# Patient Record
Sex: Female | Born: 1990 | Race: White | Hispanic: No | Marital: Single | Smoking: Current some day smoker
Health system: Southern US, Community
[De-identification: ages and names within clinical notes are randomized; demographics above are authoritative.]

## PROBLEM LIST (undated history)

## (undated) HISTORY — PX: TONSILLECTOMY: SUR1361

---
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
## 2008-06-15 ENCOUNTER — Emergency Department (HOSPITAL_COMMUNITY): Admission: EM | Admit: 2008-06-15 | Discharge: 2008-06-15 | Payer: Self-pay | Admitting: Emergency Medicine

## 2010-03-07 ENCOUNTER — Encounter: Payer: Self-pay | Admitting: Nurse Practitioner

## 2010-04-05 ENCOUNTER — Encounter: Payer: Self-pay | Admitting: Nurse Practitioner

## 2010-06-14 LAB — URINALYSIS, ROUTINE W REFLEX MICROSCOPIC
Bilirubin Urine: NEGATIVE
Nitrite: NEGATIVE
Specific Gravity, Urine: >1.03 — ABNORMAL HIGH (ref 1.005–1.030)
Urobilinogen, UA: 0.2 mg/dL (ref 0.0–1.0)

## 2010-06-14 LAB — POCT I-STAT, CHEM 8
Calcium, Ion: 1.15 mmol/L (ref 1.12–1.32)
Glucose, Bld: 95 mg/dL (ref 70–99)
HCT: 41 % (ref 36.0–46.0)
Hemoglobin: 13.9 g/dL (ref 12.0–15.0)

## 2010-06-14 LAB — PREGNANCY, URINE: Preg Test, Ur: NEGATIVE

## 2013-06-04 ENCOUNTER — Ambulatory Visit: Payer: Self-pay | Admitting: Internal Medicine

## 2014-02-07 ENCOUNTER — Emergency Department (HOSPITAL_COMMUNITY)
Admission: EM | Admit: 2014-02-07 | Discharge: 2014-02-07 | Disposition: A | Discharge status: Home / Self Care | Payer: Medicaid Other | Attending: Emergency Medicine | Admitting: Emergency Medicine

## 2014-02-07 ENCOUNTER — Encounter (HOSPITAL_COMMUNITY): Payer: Self-pay | Admitting: *Deleted

## 2014-02-07 DIAGNOSIS — W57XXXA Bitten or stung by nonvenomous insect and other nonvenomous arthropods, initial encounter: Secondary | ICD-10-CM

## 2014-02-07 DIAGNOSIS — Z720 Tobacco use: Secondary | ICD-10-CM | POA: Insufficient documentation

## 2014-02-07 DIAGNOSIS — X58XXXA Exposure to other specified factors, initial encounter: Secondary | ICD-10-CM | POA: Diagnosis not present

## 2014-02-07 DIAGNOSIS — Y9389 Activity, other specified: Secondary | ICD-10-CM | POA: Insufficient documentation

## 2014-02-07 DIAGNOSIS — Y998 Other external cause status: Secondary | ICD-10-CM | POA: Diagnosis not present

## 2014-02-07 DIAGNOSIS — T63481A Toxic effect of venom of other arthropod, accidental (unintentional), initial encounter: Secondary | ICD-10-CM | POA: Diagnosis present

## 2014-02-07 DIAGNOSIS — Y9289 Other specified places as the place of occurrence of the external cause: Secondary | ICD-10-CM | POA: Diagnosis not present

## 2014-02-07 MED ORDER — CLINDAMYCIN HCL 150 MG PO CAPS: 150.0000 mg | ORAL_CAPSULE | Freq: Four times a day (QID) | ORAL | Status: DC

## 2014-02-07 MED ORDER — DIPHENHYDRAMINE HCL 25 MG PO CAPS
50.0000 mg | ORAL_CAPSULE | Freq: Once | ORAL | Status: AC
  Administered 2014-02-07: 50 mg via ORAL
  Filled 2014-02-07: qty 2

## 2014-02-07 NOTE — Discharge Instructions (Signed)

## 2014-02-07 NOTE — ED Provider Notes (Signed)
CSN: [id number]     Arrival date & time 02/07/14  0608 History   First MD Initiated Contact with Patient 02/07/14 [phone]     Chief Complaint  Patient presents with  . Insect Bite     (Consider location/radiation/quality/duration/timing/severity/associated sxs/prior Treatment) HPI This is a 23 year old female comes in today complaining of insect bite to left upper arm. She states it occurred yesterday morning. She felt something and woke up brushing away altogether and pressure again. She never saw what the insect was noted these 2 areas of the left upper arm that looked like insect stings. She has had some increased redness around the area. She has a stinging sensation and it itches. She has not had any adenopathy, streaking, or fever. She denies any immunosuppression. She has not had any similar episodes in the past. She has not taken any medication or treated this in any way. History reviewed. No pertinent past medical history. Past Surgical History  Procedure Laterality Date  . Tonsillectomy     History reviewed. No pertinent family history. History  Substance Use Topics  . Smoking status: Current Some Day Smoker -- 0.50 packs/day  . Smokeless tobacco: Not on file  . Alcohol Use: Yes     Comment: socially   OB History    No data available     Review of Systems  All other systems reviewed and are negative.     Allergies  Review of patient's allergies indicates no known allergies.  Home Medications   Prior to Admission medications   Medication Sig Start Date End Date Taking? Authorizing Provider  clindamycin (CLEOCIN) 150 MG capsule Take 1 capsule (150 mg total) by mouth every 6 (six) hours. 02/07/14   Hilario Quarryanielle S Sabine Tenenbaum, MD   BP 128/77 mmHg  Pulse 102  Temp(Src) 98.1 F (36.7 C) (Oral)  Resp 20  Ht 5\' 2"  (1.575 m)  Wt 160 lb (72.576 kg)  BMI 29.26 kg/m2  SpO2 100% Physical Exam  Constitutional: She appears well-developed and well-nourished.  HENT:  Head:  Normocephalic.  Eyes: EOM are normal. Pupils are equal, round, and reactive to light.  Neck: Normal range of motion. Neck supple.  Cardiovascular: Normal rate and regular rhythm.   Pulmonary/Chest: Effort normal and breath sounds normal.  Abdominal: Soft.  Musculoskeletal:       Arms: 2 excoriated areas left upper arm with some spreading erythema. Please see photo in chart added by Dr. Lynelle DoctorKnapp.  Nursing note and vitals reviewed.   ED Course  Procedures (including critical care time) Labs Review Labs Reviewed - No data to display  Imaging Review No results found.   EKG Interpretation None      MDM   Final diagnoses:  Insect bite  Local reaction to insect sting, accidental or unintentional, initial encounter   23 year old female previously healthy with insect bite to left upper extremity with surrounding erythema. This Poullard be a large localized reaction or some cellulitis. She is treated with Benadryl and clindamycin. She is instructed regarding return precautions and need for follow-up.    Hilario Quarryanielle S Jenipher Havel, MD 02/07/14 [phone]
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
## 2014-02-07 NOTE — ED Notes (Signed)
Pt c/o "brown recluse bite" that occurred x 1 day ago.  Pt has 2 small raised areas that are circled by redness;

## 2014-08-11 ENCOUNTER — Encounter (HOSPITAL_COMMUNITY): Payer: Self-pay | Admitting: Emergency Medicine

## 2014-08-11 ENCOUNTER — Emergency Department (HOSPITAL_COMMUNITY): Payer: Medicaid Other

## 2014-08-11 ENCOUNTER — Emergency Department (HOSPITAL_COMMUNITY)
Admission: EM | Admit: 2014-08-11 | Discharge: 2014-08-11 | Disposition: A | Discharge status: Home / Self Care | Payer: Medicaid Other | Attending: Emergency Medicine | Admitting: Emergency Medicine

## 2014-08-11 DIAGNOSIS — S39012A Strain of muscle, fascia and tendon of lower back, initial encounter: Secondary | ICD-10-CM

## 2014-08-11 DIAGNOSIS — Z3202 Encounter for pregnancy test, result negative: Secondary | ICD-10-CM | POA: Diagnosis not present

## 2014-08-11 DIAGNOSIS — Y9389 Activity, other specified: Secondary | ICD-10-CM | POA: Insufficient documentation

## 2014-08-11 DIAGNOSIS — Y92410 Unspecified street and highway as the place of occurrence of the external cause: Secondary | ICD-10-CM | POA: Insufficient documentation

## 2014-08-11 DIAGNOSIS — Y998 Other external cause status: Secondary | ICD-10-CM | POA: Insufficient documentation

## 2014-08-11 DIAGNOSIS — M5431 Sciatica, right side: Secondary | ICD-10-CM | POA: Diagnosis not present

## 2014-08-11 DIAGNOSIS — Z720 Tobacco use: Secondary | ICD-10-CM | POA: Insufficient documentation

## 2014-08-11 DIAGNOSIS — S3992XA Unspecified injury of lower back, initial encounter: Secondary | ICD-10-CM | POA: Diagnosis present

## 2014-08-11 LAB — POC URINE PREG, ED: PREG TEST UR: NEGATIVE

## 2014-08-11 MED ORDER — HYDROCODONE-ACETAMINOPHEN 5-325 MG PO TABS: ORAL_TABLET | ORAL | Status: DC

## 2014-08-11 MED ORDER — CYCLOBENZAPRINE HCL 10 MG PO TABS: 10.0000 mg | ORAL_TABLET | Freq: Three times a day (TID) | ORAL | Status: DC | PRN

## 2014-08-11 MED ORDER — HYDROCODONE-ACETAMINOPHEN 5-325 MG PO TABS
1.0000 | ORAL_TABLET | Freq: Once | ORAL | Status: AC
  Administered 2014-08-11: 1 via ORAL
  Filled 2014-08-11: qty 1

## 2014-08-11 MED ORDER — IBUPROFEN 800 MG PO TABS
800.0000 mg | ORAL_TABLET | Freq: Once | ORAL | Status: AC
  Administered 2014-08-11: 800 mg via ORAL
  Filled 2014-08-11: qty 1
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
## 2014-08-11 NOTE — ED Notes (Signed)
Pt made aware to return if symptoms worsen or if any life threatening symptoms occur.   
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
## 2014-08-16 ENCOUNTER — Telehealth: Payer: Self-pay | Admitting: Orthopedic Surgery
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
## 2014-08-19 ENCOUNTER — Encounter (HOSPITAL_COMMUNITY): Payer: Self-pay | Admitting: Cardiology

## 2014-08-19 ENCOUNTER — Emergency Department (HOSPITAL_COMMUNITY)
Admission: EM | Admit: 2014-08-19 | Discharge: 2014-08-19 | Disposition: A | Discharge status: Home / Self Care | Payer: Medicaid Other | Attending: Emergency Medicine | Admitting: Emergency Medicine

## 2014-08-19 DIAGNOSIS — Z720 Tobacco use: Secondary | ICD-10-CM | POA: Diagnosis not present

## 2014-08-19 DIAGNOSIS — M545 Low back pain: Secondary | ICD-10-CM | POA: Diagnosis present

## 2014-08-19 DIAGNOSIS — S39012D Strain of muscle, fascia and tendon of lower back, subsequent encounter: Secondary | ICD-10-CM | POA: Diagnosis not present

## 2014-08-19 DIAGNOSIS — Z7982 Long term (current) use of aspirin: Secondary | ICD-10-CM | POA: Insufficient documentation

## 2014-08-19 DIAGNOSIS — G8911 Acute pain due to trauma: Secondary | ICD-10-CM | POA: Insufficient documentation

## 2014-08-19 MED ORDER — PREDNISONE 10 MG PO TABS: ORAL_TABLET | ORAL | Status: DC

## 2014-08-19 MED ORDER — CYCLOBENZAPRINE HCL 5 MG PO TABS: 5.0000 mg | ORAL_TABLET | Freq: Three times a day (TID) | ORAL | Status: DC | PRN

## 2014-08-19 MED ORDER — TRAMADOL HCL 50 MG PO TABS: 50.0000 mg | ORAL_TABLET | Freq: Four times a day (QID) | ORAL | Status: DC | PRN

## 2014-08-19 NOTE — ED Notes (Signed)
Patient with no complaints at this time. Respirations even and unlabored. Skin warm/dry. Discharge instructions reviewed with patient at this time. Patient given opportunity to voice concerns/ask questions. Patient discharged at this time and left Emergency Department with steady gait.   

## 2014-08-19 NOTE — Discharge Instructions (Signed)
Lumbosacral Strain Lumbosacral strain is a strain of any of the parts that make up your lumbosacral vertebrae. Your lumbosacral vertebrae are the bones that make up the lower third of your backbone. Your lumbosacral vertebrae are held together by muscles and tough, fibrous tissue (ligaments).  CAUSES  A sudden blow to your back can cause lumbosacral strain. Also, anything that causes an excessive stretch of the muscles in the low back can cause this strain. This is typically seen when people exert themselves strenuously, fall, lift heavy objects, bend, or crouch repeatedly. RISK FACTORS  Physically demanding work.  Participation in pushing or pulling sports or sports that require a sudden twist of the back (tennis, golf, baseball).  Weight lifting.  Excessive lower back curvature.  Forward-tilted pelvis.  Weak back or abdominal muscles or both.  Tight hamstrings. SIGNS AND SYMPTOMS  Lumbosacral strain [place] cause pain in the area of your injury or pain that moves (radiates) down your leg.  DIAGNOSIS Your health care provider can often diagnose lumbosacral strain through a physical exam. In some cases, you [place] need tests such as X-ray exams.  TREATMENT  Treatment for your lower back injury depends on many factors that your clinician will have to evaluate. However, most treatment will include the use of anti-inflammatory medicines. HOME CARE INSTRUCTIONS   Avoid hard physical activities (tennis, racquetball, waterskiing) if you are not in proper physical condition for it. This [place] aggravate or create problems.  If you have a back problem, avoid sports requiring sudden body movements. Swimming and walking are generally safer activities.  Maintain good posture.  Maintain a healthy weight.  For acute conditions, you [place] put ice on the injured area.  Put ice in a plastic bag.  Place a towel between your skin and the bag.  Leave the ice on for 20 minutes, 2-3 times a day.  When the  low back starts healing, stretching and strengthening exercises [place] be recommended. SEEK MEDICAL CARE IF:  Your back pain is getting worse.  You experience severe back pain not relieved with medicines. SEEK IMMEDIATE MEDICAL CARE IF:   You have numbness, tingling, weakness, or problems with the use of your arms or legs.  There is a change in bowel or bladder control.  You have increasing pain in any area of the body, including your belly (abdomen).  You notice shortness of breath, dizziness, or feel faint.  You feel sick to your stomach (nauseous), are throwing up (vomiting), or become sweaty.  You notice discoloration of your toes or legs, or your feet get very cold. MAKE SURE YOU:   Understand these instructions.  Will watch your condition.  Will get help right away if you are not doing well or get worse. Document Released: 11/29/2004 Document Revised: 02/24/2013 Document Reviewed: 10/08/2012 [facility] Patient Information 2015 [place], [place]. This information is not intended to replace advice given to you by your health care provider. Make sure you discuss any questions you have with your health care provider.   Use the medicines prescribed.  Do not drive within 4 hours of taking flexeril or tramadol.  See Dr. Romeo Apple as planned if your symptoms persist.  Use a heating pad 20 minutes 3-4 times daily.
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
## 2014-08-31 ENCOUNTER — Ambulatory Visit (INDEPENDENT_AMBULATORY_CARE_PROVIDER_SITE_OTHER): Payer: Medicaid Other

## 2014-08-31 ENCOUNTER — Encounter: Payer: Self-pay | Admitting: Orthopedic Surgery

## 2014-08-31 ENCOUNTER — Ambulatory Visit (INDEPENDENT_AMBULATORY_CARE_PROVIDER_SITE_OTHER): Payer: Medicaid Other | Admitting: Orthopedic Surgery

## 2014-08-31 VITALS — BP 116/73 | Ht 62.0 in | Wt 160.0 lb

## 2014-08-31 DIAGNOSIS — M5440 Lumbago with sciatica, unspecified side: Secondary | ICD-10-CM | POA: Diagnosis not present

## 2014-08-31 DIAGNOSIS — M542 Cervicalgia: Secondary | ICD-10-CM | POA: Diagnosis not present

## 2014-08-31 MED ORDER — IBUPROFEN 800 MG PO TABS: 800.0000 mg | ORAL_TABLET | Freq: Three times a day (TID) | ORAL | Status: AC | PRN

## 2014-08-31 MED ORDER — CYCLOBENZAPRINE HCL 5 MG PO TABS: 5.0000 mg | ORAL_TABLET | Freq: Three times a day (TID) | ORAL | Status: DC | PRN

## 2014-08-31 NOTE — Patient Instructions (Signed)
Need to see Dr Ladona Ridgelaylor ( Chiropractor )   Two meds sent to your pharmacy
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
## 2014-09-08 ENCOUNTER — Telehealth: Payer: Self-pay | Admitting: Orthopedic Surgery

## 2014-09-08 NOTE — Telephone Encounter (Signed)
Patient is aware 

## 2014-09-08 NOTE — Telephone Encounter (Signed)
Please have her relay to Dr Ladona Ridgelaylor   She does not need further care with me
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
## 2014-09-16 ENCOUNTER — Encounter (HOSPITAL_COMMUNITY): Payer: Self-pay | Admitting: Emergency Medicine

## 2014-09-16 ENCOUNTER — Emergency Department (HOSPITAL_COMMUNITY)
Admission: EM | Admit: 2014-09-16 | Discharge: 2014-09-16 | Disposition: A | Discharge status: Home / Self Care | Payer: No Typology Code available for payment source | Attending: Emergency Medicine | Admitting: Emergency Medicine

## 2014-09-16 DIAGNOSIS — Z7982 Long term (current) use of aspirin: Secondary | ICD-10-CM | POA: Insufficient documentation

## 2014-09-16 DIAGNOSIS — R51 Headache: Secondary | ICD-10-CM | POA: Insufficient documentation

## 2014-09-16 DIAGNOSIS — S46819D Strain of other muscles, fascia and tendons at shoulder and upper arm level, unspecified arm, subsequent encounter: Secondary | ICD-10-CM

## 2014-09-16 DIAGNOSIS — F419 Anxiety disorder, unspecified: Secondary | ICD-10-CM | POA: Insufficient documentation

## 2014-09-16 DIAGNOSIS — Z720 Tobacco use: Secondary | ICD-10-CM | POA: Diagnosis not present

## 2014-09-16 DIAGNOSIS — S46919D Strain of unspecified muscle, fascia and tendon at shoulder and upper arm level, unspecified arm, subsequent encounter: Secondary | ICD-10-CM | POA: Diagnosis not present

## 2014-09-16 DIAGNOSIS — S39012D Strain of muscle, fascia and tendon of lower back, subsequent encounter: Secondary | ICD-10-CM | POA: Diagnosis not present

## 2014-09-16 DIAGNOSIS — M545 Low back pain: Secondary | ICD-10-CM | POA: Diagnosis present

## 2014-09-16 MED ORDER — DICLOFENAC SODIUM 75 MG PO TBEC: 75.0000 mg | DELAYED_RELEASE_TABLET | Freq: Two times a day (BID) | ORAL | Status: DC

## 2014-09-16 MED ORDER — ONDANSETRON HCL 4 MG PO TABS
4.0000 mg | ORAL_TABLET | Freq: Once | ORAL | Status: AC
  Administered 2014-09-16: 4 mg via ORAL
  Filled 2014-09-16: qty 1

## 2014-09-16 MED ORDER — DEXAMETHASONE 4 MG PO TABS: 4.0000 mg | ORAL_TABLET | Freq: Two times a day (BID) | ORAL | Status: DC

## 2014-09-16 MED ORDER — DIAZEPAM 5 MG PO TABS: ORAL_TABLET | ORAL | Status: DC

## 2014-09-16 MED ORDER — KETOROLAC TROMETHAMINE 60 MG/2ML IM SOLN
60.0000 mg | Freq: Once | INTRAMUSCULAR | Status: AC
  Administered 2014-09-16: 60 mg via INTRAMUSCULAR
  Filled 2014-09-16: qty 2

## 2014-09-16 MED ORDER — DIAZEPAM 5 MG PO TABS
10.0000 mg | ORAL_TABLET | Freq: Once | ORAL | Status: AC
  Administered 2014-09-16: 10 mg via ORAL
  Filled 2014-09-16: qty 2

## 2014-09-16 MED ORDER — DEXAMETHASONE SODIUM PHOSPHATE 4 MG/ML IJ SOLN
8.0000 mg | Freq: Once | INTRAMUSCULAR | Status: AC
  Administered 2014-09-16: 8 mg via INTRAMUSCULAR
  Filled 2014-09-16: qty 2

## 2014-09-16 NOTE — Discharge Instructions (Signed)
Your vital signs are nonacute at this time. Your examination suggest muscle/musculoskeletal pain and spasm. No gross neurologic deficit appreciated at this time. Please see your chiropractic physician, or your primary physician for referral to pain management. I have listed two specialist in this area that you K[place]h want to consider, or discuss with your primary physician, or your chiropractic physician. Please use of Valium with breakfast lunch and bedtime for muscle spasm and pain. Please also use Decadron and diclofenac daily. Please take these medications with food. Please use warm tub soaks daily for 15-20 minutes. Muscle Strain A muscle strain is an injury that occurs when a muscle is stretched beyond its normal length. Usually a small number of muscle fibers are torn when this happens. Muscle strain is rated in degrees. First-degree strains have the least amount of muscle fiber tearing and pain. Second-degree and third-degree strains have increasingly more tearing and pain.  Usually, recovery from muscle strain takes 1-2 weeks. Complete healing takes 5-6 weeks.  CAUSES  Muscle strain happens when a sudden, violent force placed on a muscle stretches it too far. This [place] occur with lifting, sports, or a fall.  RISK FACTORS Muscle strain is especially common in athletes.  SIGNS AND SYMPTOMS At the site of the muscle strain, there [place] be:  Pain.  Bruising.  Swelling.  Difficulty using the muscle due to pain or lack of normal function. DIAGNOSIS  Your health care provider will perform a physical exam and ask about your medical history. TREATMENT  Often, the best treatment for a muscle strain is resting, icing, and applying cold compresses to the injured area.  HOME CARE INSTRUCTIONS   Use the PRICE method of treatment to promote muscle healing during the first 2-3 days after your injury. The PRICE method involves:  Protecting the muscle from being injured again.  Restricting your  activity and resting the injured body part.  Icing your injury. To do this, put ice in a plastic bag. Place a towel between your skin and the bag. Then, apply the ice and leave it on from 15-20 minutes each hour. After the third day, switch to moist heat packs.  Apply compression to the injured area with a splint or elastic bandage. Be careful not to wrap it too tightly. This [place] interfere with blood circulation or increase swelling.  Elevate the injured body part above the level of your heart as often as you can.  Only take over-the-counter or prescription medicines for pain, discomfort, or fever as directed by your health care provider.  Warming up prior to exercise helps to prevent future muscle strains. SEEK MEDICAL CARE IF:   You have increasing pain or swelling in the injured area.  You have numbness, tingling, or a significant loss of strength in the injured area. MAKE SURE YOU:   Understand these instructions.  Will watch your condition.  Will get help right away if you are not doing well or get worse. Document Released: 02/19/2005 Document Revised: 12/10/2012 Document Reviewed: 09/18/2012 [facility] Patient Information 2015 [place], [place]. This information is not intended to replace advice given to you by your health care provider. Make sure you discuss any questions you have with your health care provider.  Lumbosacral Strain Lumbosacral strain is a strain of any of the parts that make up your lumbosacral vertebrae. Your lumbosacral vertebrae are the bones that make up the lower third of your backbone. Your lumbosacral vertebrae are held together by muscles and tough, fibrous tissue (ligaments).  CAUSES  A sudden blow to your back can cause lumbosacral strain. Also, anything that causes an excessive stretch of the muscles in the low back can cause this strain. This is typically seen when people exert themselves strenuously, fall, lift heavy objects, bend, or crouch  repeatedly. RISK FACTORS  Physically demanding work.  Participation in pushing or pulling sports or sports that require a sudden twist of the back (tennis, golf, baseball).  Weight lifting.  Excessive lower back curvature.  Forward-tilted pelvis.  Weak back or abdominal muscles or both.  Tight hamstrings. SIGNS AND SYMPTOMS  Lumbosacral strain Bruns cause pain in the area of your injury or pain that moves (radiates) down your leg.  DIAGNOSIS Your health care provider can often diagnose lumbosacral strain through a physical exam. In some cases, you [place] need tests such as X-ray exams.  TREATMENT  Treatment for your lower back injury depends on many factors that your clinician will have to evaluate. However, most treatment will include the use of anti-inflammatory medicines. HOME CARE INSTRUCTIONS   Avoid hard physical activities (tennis, racquetball, waterskiing) if you are not in proper physical condition for it. This [place] aggravate or create problems.  If you have a back problem, avoid sports requiring sudden body movements. Swimming and walking are generally safer activities.  Maintain good posture.  Maintain a healthy weight.  For acute conditions, you [place] put ice on the injured area.  Put ice in a plastic bag.  Place a towel between your skin and the bag.  Leave the ice on for 20 minutes, 2-3 times a day.  When the low back starts healing, stretching and strengthening exercises [place] be recommended. SEEK MEDICAL CARE IF:  Your back pain is getting worse.  You experience severe back pain not relieved with medicines. SEEK IMMEDIATE MEDICAL CARE IF:   You have numbness, tingling, weakness, or problems with the use of your arms or legs.  There is a change in bowel or bladder control.  You have increasing pain in any area of the body, including your belly (abdomen).  You notice shortness of breath, dizziness, or feel faint.  You feel sick to your stomach (nauseous),  are throwing up (vomiting), or become sweaty.  You notice discoloration of your toes or legs, or your feet get very cold. MAKE SURE YOU:   Understand these instructions.  Will watch your condition.  Will get help right away if you are not doing well or get worse. Document Released: 11/29/2004 Document Revised: 02/24/2013 Document Reviewed: 10/08/2012 [facility] Patient Information 2015 [place]. This information is not intended to replace advice given to you by your health care provider. Make sure you discuss any questions you have with your health care provider.

## 2014-09-16 NOTE — ED Notes (Signed)
[facility] and has been experiencing continuous pain from back of neck, bilateral shoulder blades, lower back, radiating down bilateral legs. States she was treated here for same.
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
## 2014-09-29 ENCOUNTER — Other Ambulatory Visit (HOSPITAL_COMMUNITY): Payer: Self-pay | Admitting: Nurse Practitioner

## 2014-09-29 DIAGNOSIS — M549 Dorsalgia, unspecified: Secondary | ICD-10-CM

## 2014-09-29 DIAGNOSIS — R202 Paresthesia of skin: Secondary | ICD-10-CM

## 2014-10-12 ENCOUNTER — Ambulatory Visit (HOSPITAL_COMMUNITY)
Admission: RE | Admit: 2014-10-12 | Discharge: 2014-10-12 | Disposition: A | Discharge status: Home / Self Care | Payer: No Typology Code available for payment source | Source: Ambulatory Visit | Attending: Nurse Practitioner | Admitting: Nurse Practitioner

## 2014-10-12 ENCOUNTER — Other Ambulatory Visit (HOSPITAL_COMMUNITY): Payer: Self-pay | Admitting: Nurse Practitioner

## 2014-10-12 DIAGNOSIS — M545 Low back pain: Secondary | ICD-10-CM | POA: Diagnosis present

## 2014-10-12 DIAGNOSIS — M5126 Other intervertebral disc displacement, lumbar region: Secondary | ICD-10-CM | POA: Insufficient documentation

## 2014-10-12 DIAGNOSIS — M549 Dorsalgia, unspecified: Secondary | ICD-10-CM

## 2014-10-12 DIAGNOSIS — E041 Nontoxic single thyroid nodule: Secondary | ICD-10-CM

## 2014-10-12 DIAGNOSIS — R202 Paresthesia of skin: Secondary | ICD-10-CM

## 2014-10-18 ENCOUNTER — Ambulatory Visit (HOSPITAL_COMMUNITY): Admission: RE | Admit: 2014-10-18 | Payer: No Typology Code available for payment source | Source: Ambulatory Visit

## 2014-10-29 ENCOUNTER — Ambulatory Visit (HOSPITAL_COMMUNITY)
Admission: RE | Admit: 2014-10-29 | Discharge: 2014-10-29 | Disposition: A | Discharge status: Home / Self Care | Payer: Medicaid Other | Source: Ambulatory Visit | Attending: Nurse Practitioner | Admitting: Nurse Practitioner

## 2014-10-29 ENCOUNTER — Ambulatory Visit (HOSPITAL_COMMUNITY): Admission: RE | Admit: 2014-10-29 | Payer: No Typology Code available for payment source | Source: Ambulatory Visit

## 2014-10-29 DIAGNOSIS — E041 Nontoxic single thyroid nodule: Secondary | ICD-10-CM

## 2015-01-03 ENCOUNTER — Encounter: Payer: Self-pay | Admitting: "Endocrinology

## 2015-01-03 ENCOUNTER — Ambulatory Visit (INDEPENDENT_AMBULATORY_CARE_PROVIDER_SITE_OTHER): Payer: Medicaid Other | Admitting: "Endocrinology

## 2015-01-03 VITALS — BP 120/79 | HR 100 | Ht 62.0 in | Wt 170.0 lb

## 2015-01-03 DIAGNOSIS — E049 Nontoxic goiter, unspecified: Secondary | ICD-10-CM | POA: Insufficient documentation

## 2015-01-03 DIAGNOSIS — E042 Nontoxic multinodular goiter: Secondary | ICD-10-CM

## 2015-01-03 LAB — TSH: TSH: 0.934 u[IU]/mL (ref 0.350–4.500)

## 2015-01-03 LAB — T4, FREE: FREE T4: 1.01 ng/dL (ref 0.80–1.80)

## 2015-01-03 NOTE — Progress Notes (Signed)
HPI  Julia [name] is a 24 y.o.-year-old female, referred by her PCP, [name], NP, for evaluation for nodular goiter.  Thyroid U/S: On 10/29/2014 showed right lobe 4.9 cm with 1.9 seeing the dominant partially cystic nodule. Left lobe 3.7 cm with no nodules. Pt reports occasional scratchy sensation in her throat however denies dysphagia, choking, voice change, and shortness of breath. I reviewed pt's thyroid tests: From [place] 2016 free T4 0.9 and TSH 1.85.    Pt c/o: Unquantified weight gain over a period of years feeling fatigued. She denies heat intolerance court intolerance palpitations nor sweating   No FH of thyroid ds. No FH of thyroid cancer. No h/o radiation tx to head or neck.   ROS: Constitutional: +weight gain, +fatigue, no subjective hyperthermia/hypothermia Eyes: no blurry vision, no xerophthalmia ENT: no sore throat, no nodules palpated in throat, no dysphagia/odynophagia, no hoarseness Cardiovascular: no CP/SOB/palpitations/leg swelling Respiratory: no cough/SOB Gastrointestinal: no N/V/D/C Musculoskeletal: no muscle/joint aches Skin: no rashes Neurological: no tremors/numbness/tingling/dizziness Psychiatric: no depression/anxiety  PE: BP 120/79 mmHg  Pulse 100  Ht 5\' 2"  (1.575 m)  Wt 170 lb (77.111 kg)  BMI 31.09 kg/m2  SpO2 100% Wt Readings from Last 3 Encounters:  01/03/15 170 lb (77.111 kg)  09/16/14 165 lb (74.844 kg)  08/31/14 160 lb (72.576 kg)    Constitutional: overweight, in NAD Eyes: PERRLA, EOMI, no exophthalmos ENT: moist mucous membranes,  palpable thyroid,  no cervical lymphadenopathy Cardiovascular: RRR, No MRG Respiratory: CTA B Gastrointestinal: abdomen soft, NT, ND, BS+ Musculoskeletal: no deformities, strength intact in all 4;  Skin: moist, warm, no rashes Neurological: no tremor with outstretched hands, DTR normal in all 4  ASSESSMENT: 1. Nodular goiter, 1.9 cm  partially cystic solitary nodule on the right lobe . 2.  Smoker  PLAN:  - I reviewed the  thyroid ultrasound along with the patient. I pointed out that the dominant nodule on the right lobe is  Large enough, this being a risk factor for cancer.  This will need definitive study was tissue sample. I approached her for ultrasound-guided fine needle aspiration and she agrees. Further therapeutic measure will depend on the findings of the biopsy. I will also obtain fresh set of thyroid function test today.  She will return in 10 days with the above results. I counseled her about smoking cessation.   Marquis LunchGebre Pasquale Matters, MD  Tel. [phone], Fax [phone]

## 2015-01-04 LAB — THYROGLOBULIN ANTIBODY: Thyroglobulin Ab: <1 IU/mL (ref <2)

## 2015-01-04 LAB — THYROID PEROXIDASE ANTIBODY: Thyroperoxidase Ab SerPl-aCnc: <1 IU/mL (ref <9)

## 2015-01-12 ENCOUNTER — Ambulatory Visit (HOSPITAL_COMMUNITY): Admission: RE | Admit: 2015-01-12 | Payer: Medicaid Other | Source: Ambulatory Visit

## 2015-01-14 ENCOUNTER — Ambulatory Visit: Payer: Medicaid Other | Admitting: "Endocrinology

## 2015-08-09 ENCOUNTER — Telehealth: Payer: Self-pay

## 2015-08-09 NOTE — Telephone Encounter (Signed)
We saw pt on 01-03-15. She was scheduled for a thyroid biopsy and a follow up with Dr Fransico HimNida. She cancelled the follow up and no showed for the biopsy. She did not reschedule. She is now calling wanting the biopsy scheduled again and to see Dr Fransico HimNida.  Does she need an office visit first?

## 2015-08-10 NOTE — Telephone Encounter (Signed)
Yes, she would need an office visit first.

## 2015-08-10 NOTE — Telephone Encounter (Signed)
Left message for pt to contact our office and schedule appointment to see Dr Fransico HimNida.

## 2015-11-21 ENCOUNTER — Other Ambulatory Visit: Payer: Self-pay | Admitting: "Endocrinology

## 2015-11-21 DIAGNOSIS — E042 Nontoxic multinodular goiter: Secondary | ICD-10-CM

## 2015-11-21 LAB — T4, FREE: FREE T4: 1 ng/dL (ref 0.8–1.8)

## 2015-11-21 LAB — TSH: TSH: 1.49 mIU/L

## 2015-12-06 ENCOUNTER — Encounter: Payer: Self-pay | Admitting: "Endocrinology

## 2015-12-06 ENCOUNTER — Ambulatory Visit (INDEPENDENT_AMBULATORY_CARE_PROVIDER_SITE_OTHER): Payer: Medicaid Other | Admitting: "Endocrinology

## 2015-12-06 VITALS — BP 117/75 | HR 85 | Ht 62.0 in | Wt 137.0 lb

## 2015-12-06 DIAGNOSIS — Z91199 Patient's noncompliance with other medical treatment and regimen due to unspecified reason: Secondary | ICD-10-CM

## 2015-12-06 DIAGNOSIS — E049 Nontoxic goiter, unspecified: Secondary | ICD-10-CM

## 2015-12-06 DIAGNOSIS — Z9119 Patient's noncompliance with other medical treatment and regimen: Secondary | ICD-10-CM | POA: Diagnosis not present

## 2015-12-06 NOTE — Progress Notes (Signed)
HPI  Julia [name] is [name] 25 y.o.-year-old female, referred by her PCP, Julia EveryLindsey Strader, NP, for evaluation for nodular goiter.  - She was seen on October 2016 for nodular goiter and was advised to have fine needle aspiration. However unfortunately she did not follow up with that plan. She returns with repeat thyroid function tests which are within normal limits.  Her  Prior Thyroid U/S: On 10/29/2014 showed right lobe 4.9 cm with 1.9  the dominant partially cystic nodule. Left lobe 3.7 cm with no nodules. Pt still reports occasional scratchy sensation in her throat however denies dysphagia, choking, voice change, and shortness of breath.  I reviewed pt's prior thyroid tests: From [place] 2016 free T4 0.9 and TSH 1.85.    She denies heat intolerance court intolerance palpitations nor sweating. - She has [name] progressive weight loss of about 30 pounds since last year. She continues to smoke heavily.  No FH of thyroid dysfunction. No FH of thyroid cancer. No h/o radiation tx to head or neck.   ROS: Constitutional: + weight loss, +fatigue, no subjective hyperthermia/hypothermia Eyes: no blurry vision, no xerophthalmia ENT: no sore throat, no nodules palpated in throat, no dysphagia/odynophagia, no hoarseness Cardiovascular: no CP/SOB/palpitations/leg swelling Respiratory: no cough/SOB Gastrointestinal: no N/V/D/C Musculoskeletal: no muscle/joint aches Skin: no rashes Neurological: no tremors/numbness/tingling/dizziness Psychiatric: no depression/anxiety  PE: BP 117/75   Pulse 85   Ht 5\' 2"  (1.575 m)   Wt 137 lb (62.1 kg)   BMI 25.06 kg/m  Wt Readings from Last 3 Encounters:  12/06/15 137 lb (62.1 kg)  01/03/15 170 lb (77.1 kg)  09/16/14 165 lb (74.8 kg)    Constitutional:  in NAD Eyes: PERRLA, EOMI, no exophthalmos ENT: moist mucous membranes,  palpable thyroid,  no cervical lymphadenopathy Cardiovascular: RRR, No MRG Respiratory: CTA B Gastrointestinal: abdomen soft, NT, ND,  BS+ Musculoskeletal: no deformities, strength intact in all 4;  Skin: moist, warm, no rashes Neurological: no tremor with outstretched hands, DTR normal in all 4   Results for Julia [name], Julia [name] (MRN [id number]) as of 12/06/2015 10:04  Ref. Range 01/03/2015 09:46 11/21/2015 10:45 11/21/2015 10:47  TSH Latest Units: mIU/L 0.934  1.49  T4,Free(Direct) Latest Ref Range: 0.8 - 1.8 ng/dL 8.291.01 1.0   Thyroglobulin Ab Latest Ref Range: <2 IU/mL <1    Thyroperoxidase Ab SerPl-aCnc Latest Ref Range: <9 IU/mL <1      Thyroid ultrasound on August 2016 IMPRESSION: Single dominant right-sided thyroid nodule with partially cystic and solid features. Nodule meets criteria for biopsy.  Ultrasound-guided fine needle aspiration should be considered.  ASSESSMENT: 1. Nodular goiter, 1.9 cm  partially cystic solitary nodule on the right lobe . 2. Chronic heavy Smoker 3. Unintentional weight loss  PLAN: - Unfortunately patient is noncompliant. She no showed for almost [name] year, when she was supposed to return with fine needle aspiration of her thyroid nodule. - I reviewed the thyroid ultrasound along with the patient again. I pointed out that the dominant nodule on the right lobe is  Large enough, this being [name] risk factor for cancer.  This will need definitive study with [name]  tissue sample. I approached her for ultrasound-guided fine needle aspiration and she agrees. Further therapeutic measure will depend on the findings of the biopsy.  She does not have hyperthyroidism to explain the recent weight loss. She [place] need other studies if  The weight loss continues to be an issue.   I counseled her about smoking cessation.   Marquis LunchGebre Aikam Vinje, MD  Tel. [phone], Fax [phone]

## 2015-12-14 ENCOUNTER — Ambulatory Visit (HOSPITAL_COMMUNITY): Admission: RE | Admit: 2015-12-14 | Payer: Medicaid Other | Source: Ambulatory Visit

## 2015-12-21 ENCOUNTER — Ambulatory Visit: Payer: Medicaid Other | Admitting: "Endocrinology

## 2015-12-22 ENCOUNTER — Other Ambulatory Visit: Payer: Self-pay | Admitting: "Endocrinology

## 2015-12-22 DIAGNOSIS — E041 Nontoxic single thyroid nodule: Secondary | ICD-10-CM

## 2015-12-28 ENCOUNTER — Ambulatory Visit (HOSPITAL_COMMUNITY): Admission: RE | Admit: 2015-12-28 | Payer: No Typology Code available for payment source | Source: Ambulatory Visit

## 2015-12-28 ENCOUNTER — Other Ambulatory Visit (HOSPITAL_COMMUNITY): Payer: Medicaid Other

## 2015-12-30 ENCOUNTER — Other Ambulatory Visit (HOSPITAL_COMMUNITY): Payer: Medicaid Other

## 2015-12-30 ENCOUNTER — Ambulatory Visit (HOSPITAL_COMMUNITY): Payer: Medicaid Other

## 2015-12-30 ENCOUNTER — Ambulatory Visit (HOSPITAL_COMMUNITY): Admission: RE | Admit: 2015-12-30 | Payer: Medicaid Other | Source: Ambulatory Visit

## 2015-12-30 ENCOUNTER — Encounter (HOSPITAL_COMMUNITY): Payer: Self-pay

## 2015-12-30 ENCOUNTER — Ambulatory Visit (HOSPITAL_COMMUNITY)
Admission: RE | Admit: 2015-12-30 | Discharge: 2015-12-30 | Disposition: A | Discharge status: Home / Self Care | Payer: Medicaid Other | Source: Ambulatory Visit | Attending: "Endocrinology | Admitting: "Endocrinology

## 2015-12-30 DIAGNOSIS — E041 Nontoxic single thyroid nodule: Secondary | ICD-10-CM

## 2015-12-30 DIAGNOSIS — E049 Nontoxic goiter, unspecified: Secondary | ICD-10-CM

## 2016-01-04 ENCOUNTER — Encounter (HOSPITAL_COMMUNITY): Payer: Self-pay

## 2016-01-04 ENCOUNTER — Ambulatory Visit (HOSPITAL_COMMUNITY)
Admission: RE | Admit: 2016-01-04 | Discharge: 2016-01-04 | Disposition: A | Discharge status: Home / Self Care | Payer: Medicaid Other | Source: Ambulatory Visit | Attending: "Endocrinology | Admitting: "Endocrinology

## 2016-01-04 ENCOUNTER — Ambulatory Visit: Payer: Medicaid Other | Admitting: "Endocrinology

## 2016-01-04 DIAGNOSIS — E041 Nontoxic single thyroid nodule: Secondary | ICD-10-CM | POA: Insufficient documentation

## 2016-01-04 DIAGNOSIS — E049 Nontoxic goiter, unspecified: Secondary | ICD-10-CM | POA: Diagnosis present

## 2016-01-04 MED ORDER — LIDOCAINE HCL (PF) 2 % IJ SOLN
INTRAMUSCULAR | Status: AC
  Filled 2016-01-04: qty 10

## 2016-01-04 NOTE — Discharge Instructions (Signed)
Thyroid Biopsy °The thyroid gland is a butterfly-shaped gland located in the front of the neck. It produces hormones that affect metabolism, growth and development, and body temperature. Thyroid biopsy is a procedure in which small samples of tissue or fluid are removed from the thyroid gland. The samples are then looked at under a microscope to check for abnormalities. This procedure is done to determine the cause of thyroid problems. It Schrier be done to check for infection, cancer, or other thyroid problems. °Two methods [place] be used for a thyroid biopsy. In one method, a thin needle is inserted through the skin and into the thyroid gland. In the other method, an open incision is made through the skin. °LET YOUR HEALTH CARE PROVIDER KNOW ABOUT:  °· Any allergies you have. °· All medicines you are taking, including vitamins, herbs, eye drops, creams, and over-the-counter medicines. °· Previous problems you or members of your family have had with the use of anesthetics. °· Any blood disorders you have. °· Previous surgeries you have had. °· Medical conditions you have. °RISKS AND COMPLICATIONS °Generally, this is a safe procedure. However, problems can occur and include: °· Bleeding from the procedure site. °· Infection. °· Injury to structures near the thyroid gland. °BEFORE THE PROCEDURE  °· Ask your health care provider about: °¨ Changing or stopping your regular medicines. This is especially important if you are taking diabetes medicines or blood thinners. °¨ Taking medicines such as aspirin and ibuprofen. These medicines can thin your blood. Do not take these medicines before your procedure if your health care provider asks you not to. °· Do not eat or drink anything after midnight on the night before the procedure or as directed by your health care provider. °· You Guertin have a blood sample taken. °PROCEDURE °Either of these methods [place] be used to perform a thyroid biopsy: °· Fine needle biopsy. You Goodness be given  medicine to help you relax (sedative). You will be asked to lie on your back with your head tipped backward to extend your neck. An area on your neck will be cleaned. A needle will then be inserted through the skin of your neck. You Punt be asked to avoid coughing, talking, swallowing, or making sounds during some portions of the procedure. The needle will be withdrawn once the tissue or fluid samples have been removed. Pressure Jonas be applied to your neck to reduce swelling and ensure that bleeding has stopped. The samples will be sent to a lab for examination. °· Open biopsy. You will be given medicine to make you sleep (general anesthetic). An incision will be made in your neck. A sample of thyroid tissue will be removed using surgical tools. The tissue sample will be sent for examination. In some cases, the sample Melichar be examined during the biopsy. If that is done and cancer cells are found, some or all of the thyroid gland Kuznia be removed. The incision will be closed with stitches. °AFTER THE PROCEDURE  °· Your recovery will be assessed and monitored. °· You Dugal have soreness and tenderness at the site of the biopsy. This should go away after a few days. °· If you had an open biopsy, you Batson have a hoarse voice or sore throat for a couple days. °· It is your responsibility to get your test results. °  °This information is not intended to replace advice given to you by your health care provider. Make sure you discuss any questions you have with your health   care provider. °  °Document Released: 12/17/2006 Document Revised: 03/12/2014 Document Reviewed: 05/14/2013 °Elsevier Interactive Patient Education ©2016 [employer] ° °

## 2016-01-04 NOTE — Procedures (Signed)
PreOperative Dx: RT thyroid nodule Postoperative Dx: RT thyroid nodule Procedure:   US guided FNA of RT thyroid nodule Radiologist:  Denym Rahimi Anesthesia:  2 ml of 2% lidocaine Specimen:  FNA x 3  EBL:   < 1 ml Complications: None 

## 2016-01-11 ENCOUNTER — Encounter: Payer: Self-pay | Admitting: "Endocrinology

## 2016-01-11 ENCOUNTER — Ambulatory Visit (INDEPENDENT_AMBULATORY_CARE_PROVIDER_SITE_OTHER): Payer: Medicaid Other | Admitting: "Endocrinology

## 2016-01-11 VITALS — BP 125/78 | HR 93 | Ht 62.0 in | Wt 135.0 lb

## 2016-01-11 DIAGNOSIS — E049 Nontoxic goiter, unspecified: Secondary | ICD-10-CM | POA: Diagnosis not present

## 2016-01-11 NOTE — Progress Notes (Signed)
HPI  Julia [name] is [name] 25 y.o.-year-old female, referred by her PCP, Julia EveryLindsey Strader, NP, for evaluation for nodular goiter.  - She is returning with her results of fine needle aspiration of solitary thyroid nodule in the right lobe.  Her  Prior Thyroid U/S: On 10/29/2014 showed right lobe 4.9 cm with 1.9  the dominant partially cystic nodule. Left lobe 3.7 cm with no nodules. Pt still reports occasional scratchy sensation in her throat however denies dysphagia, choking, voice change, and shortness of breath.  I reviewed pt's prior thyroid tests: From [place] 2016 free T4 0.9 and TSH 1.85.    She denies heat intolerance court intolerance palpitations nor sweating. - She has [name] progressive weight loss of about 30 pounds since last year, but stable weight since last visit. She continues to smoke heavily, and complains of on and off voice hoarseness.  No FH of thyroid dysfunction. No FH of thyroid cancer. No h/o radiation tx to head or neck.   ROS: Constitutional: + weight loss, +fatigue, no subjective hyperthermia/hypothermia Eyes: no blurry vision, no xerophthalmia ENT: no sore throat, no nodules palpated in throat, no dysphagia/odynophagia, no hoarseness Cardiovascular: no CP/SOB/palpitations/leg swelling Respiratory: no cough/SOB Gastrointestinal: no N/V/D/C Musculoskeletal: no muscle/joint aches Skin: no rashes Neurological: no tremors/numbness/tingling/dizziness Psychiatric: no depression/anxiety  PE: BP 125/78   Pulse 93   Ht 5\' 2"  (1.575 m)   Wt 135 lb (61.2 kg)   BMI 24.69 kg/m  Wt Readings from Last 3 Encounters:  01/11/16 135 lb (61.2 kg)  12/06/15 137 lb (62.1 kg)  01/03/15 170 lb (77.1 kg)    Constitutional:  in NAD Eyes: PERRLA, EOMI, no exophthalmos ENT: moist mucous membranes,  palpable thyroid,  no cervical lymphadenopathy Cardiovascular: RRR, No MRG Respiratory: CTA B Gastrointestinal: abdomen soft, NT, ND, BS+ Musculoskeletal: no deformities, strength intact  in all 4;  Skin: moist, warm, no rashes Neurological: no tremor with outstretched hands, DTR normal in all 4   Results for Julia [name] (MRN [id number]) as of 12/06/2015 10:04  Ref. Range 01/03/2015 09:46 11/21/2015 10:45 11/21/2015 10:47  TSH Latest Units: mIU/L 0.934  1.49  T4,Free(Direct) Latest Ref Range: 0.8 - 1.8 ng/dL 4.091.01 1.0   Thyroglobulin Ab Latest Ref Range: <2 IU/mL <1    Thyroperoxidase Ab SerPl-aCnc Latest Ref Range: <9 IU/mL <1      Thyroid ultrasound on August 2016 IMPRESSION: Single dominant right-sided thyroid nodule with partially cystic and solid features.  Ultrasound-guided fine needle aspiration Showed benign follicular nodule.  ASSESSMENT: 1. Nodular goiter, 1.9 cm  partially cystic solitary nodule on the right lobe - status post FNA showing benign follicular nodule. 2. Chronic heavy Smoker 3. Unintentional weight loss  PLAN: - Her FNA is benign, no need for antithyroid intervention at this time. She does not have hyperthyroidism to explain the recent weight loss. - I advised her to be current on her Pap smear, if voice hoarseness continues to be [name] problem she Renovato need evaluation by ENT as [name] chronic heavy smoker. - I also advised her to try omeprazole for 8 weeks for [name] presumptive diagnosis of GERD.  I counseled her about smoking cessation.   Julia LunchGebre Marly Schuld, MD  Tel. [phone], Fax [phone]

## 2016-07-11 ENCOUNTER — Ambulatory Visit: Payer: Medicaid Other | Admitting: "Endocrinology

## 2017-01-13 IMAGING — US US THYROID
1 series · 13 of 25 positions shown · non-contrast
Comparison: Thyroid ultrasound - 10/29/2014

CLINICAL DATA: Prior ultrasound follow-up.

EXAM:
THYROID ULTRASOUND
TECHNIQUE: Ultrasound examination of the thyroid gland and adjacent soft
tissues was performed.

[Series 1: us thyroid · 0.05mm/px · 13 of 60 slices shown]
[im 1/60]
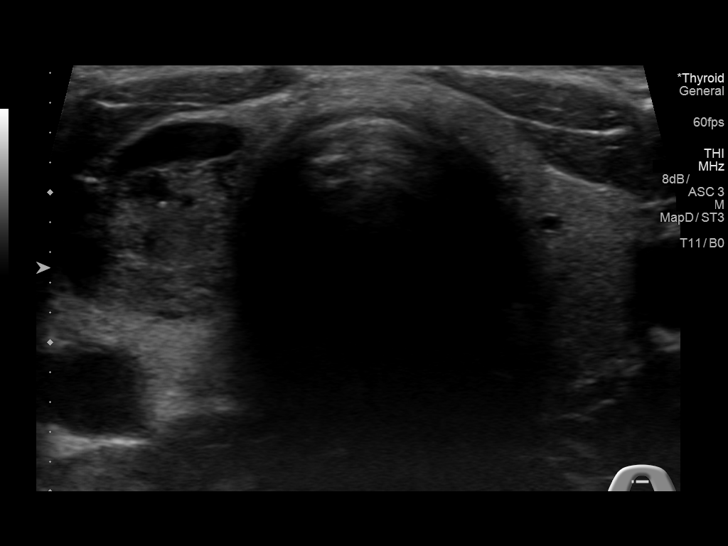
[im 5/60]
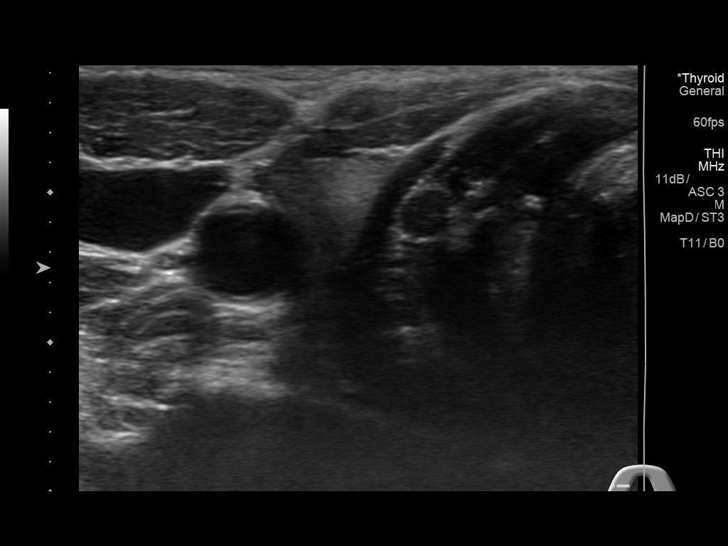
[im 10/60]
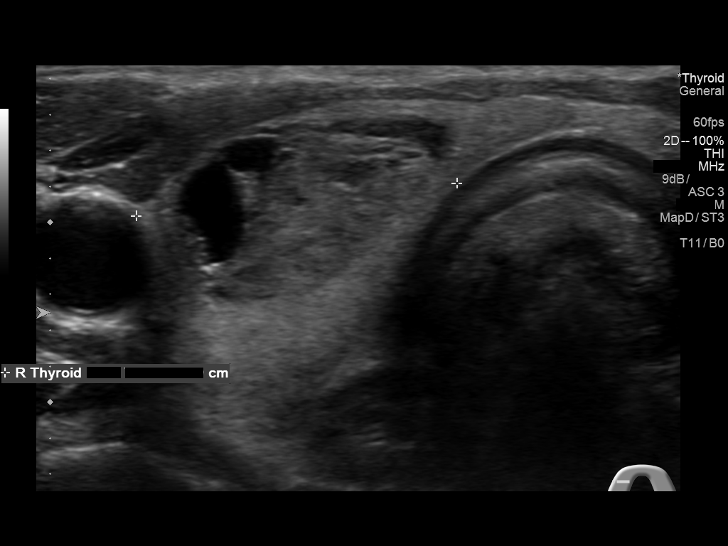
[im 15/60]
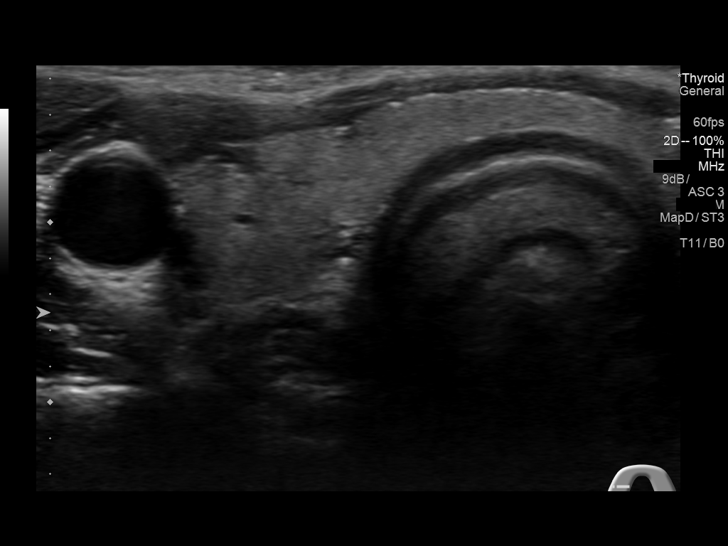
[im 20/60]
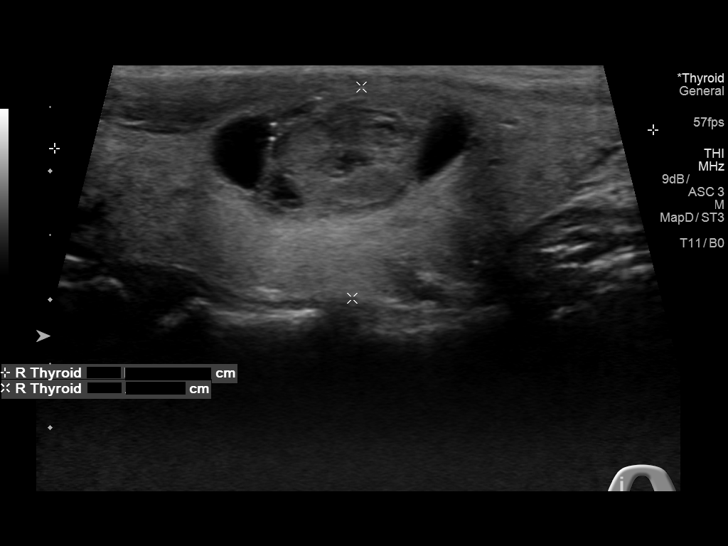
[im 25/60]
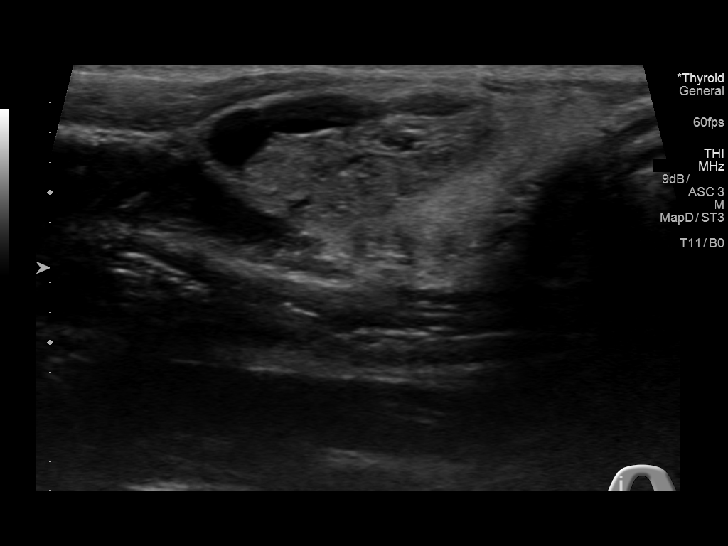
[im 30/60]
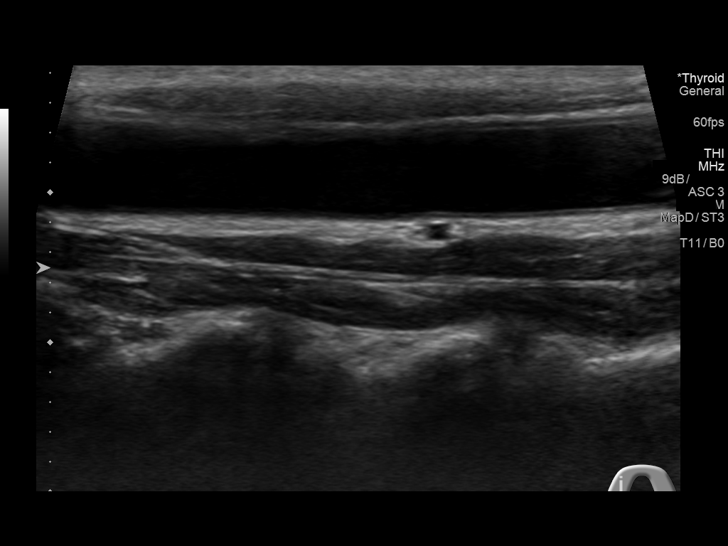
[im 35/60]
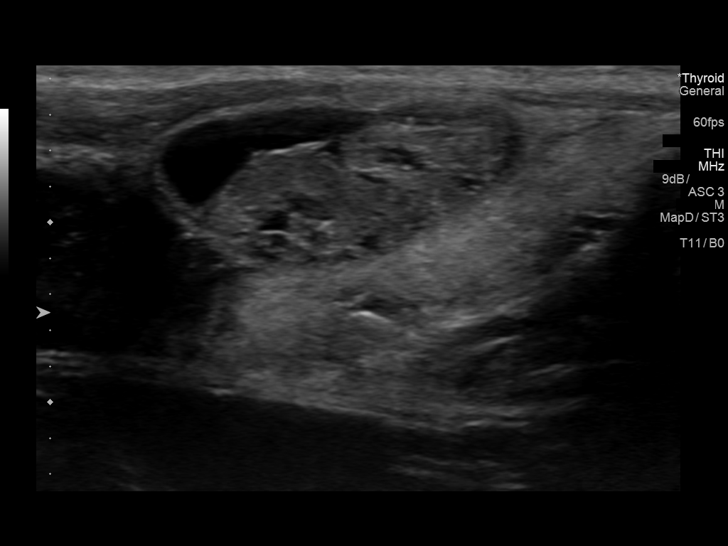
[im 40/60]
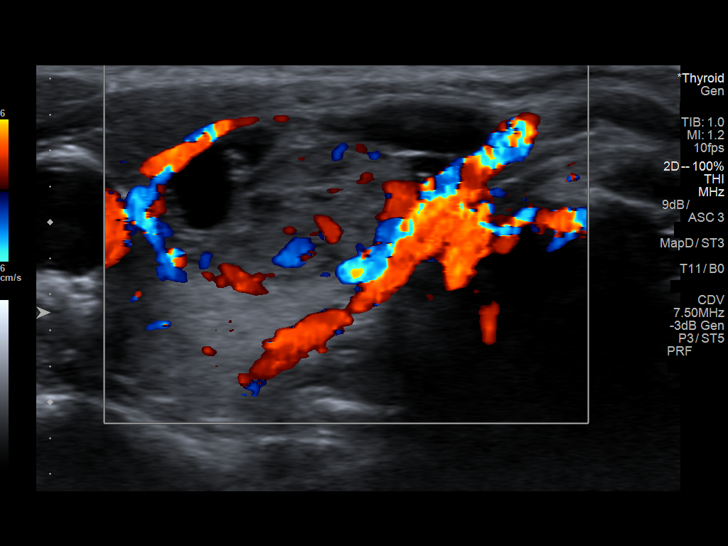
[im 45/60]
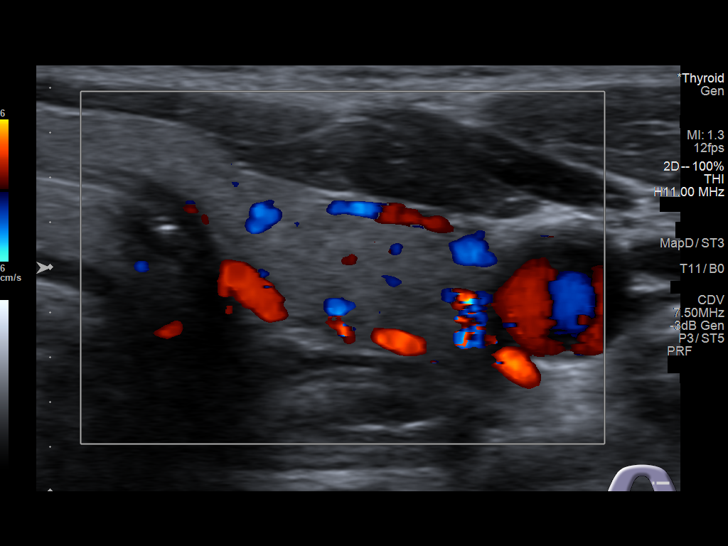
[im 50/60]
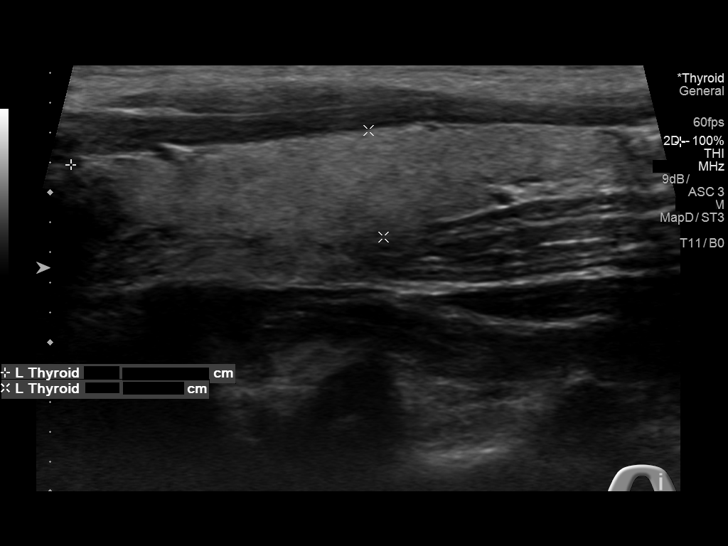
[im 55/60]
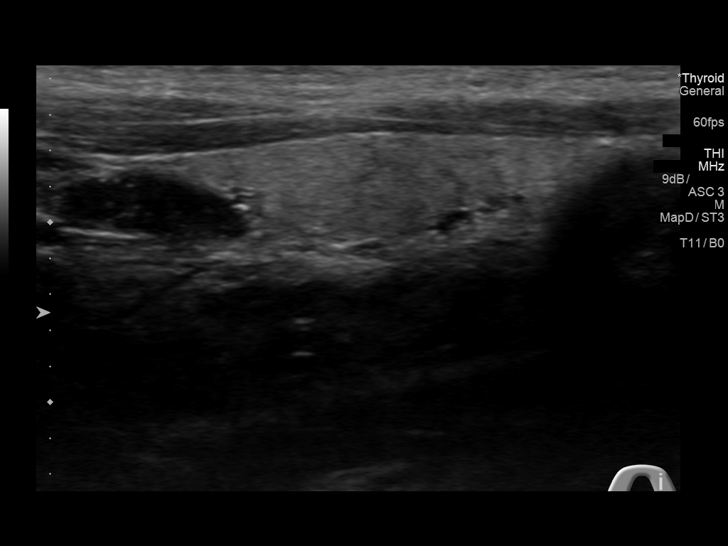
[im 60/60]
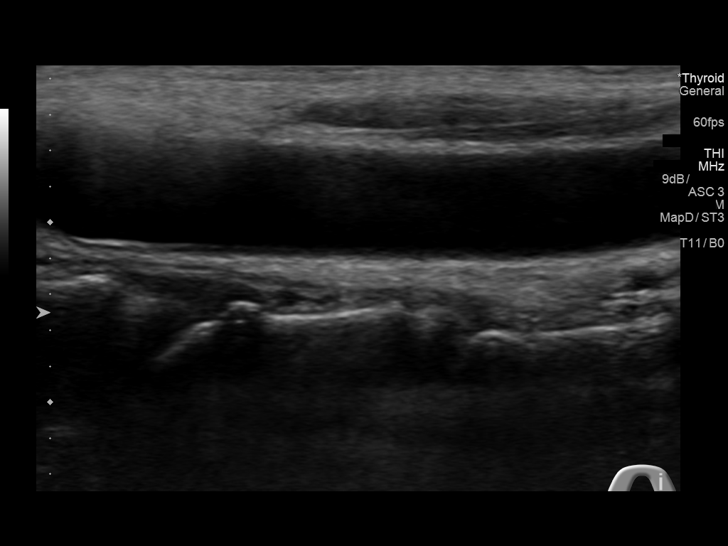

[13 of 25 positions shown; findings below may reference images not displayed]

FINDINGS: Parenchymal Echotexture: Normal

Estimated total number of nodules >/= 1 cm: 1

Number of spongiform nodules >/=  2 cm not described below (TR1): 0

Number of mixed cystic and solid nodules >/= 1.5 cm not described
below (TR2): 0

_________________________________________________________

Isthmus: Normal in size measuring 0.2 cm in diameter, unchanged

No discrete nodules are identified within the thyroid isthmus.

_________________________________________________________

Right lobe: Normal in size measuring 4.7 x 1.7 x 1.8 cm, unchanged,
previously, 4.9 x 1.7 x 1.8 cm

Nodule # 1:

Prior biopsy: No

Location: Right; Mid

Size: 2.1 x 0.9 x 1.9 cm, unchanged, previously, 1.9 x 0.9 x 1.8 cm

Composition: solid/almost completely solid (2)

Echogenicity: isoechoic (1)

Shape: not taller-than-wide (0)

Margins: smooth (0)

Echogenic foci: punctate echogenic foci (3)

ACR TI-RADS total points: 6.

ACR TI-RADS risk category: TR4 (4-6 points).

Change in features: No

Change in ACR TI-RADS risk category: No

ACR TI-RADS recommendations:

**Given size (>/= 1.5 cm) and appearance, fine needle aspiration of
this moderately suspicious nodule should be considered based on
TI-RADS criteria.

_________________________________________________________

Left lobe: Normal in size measuring 4.1 x 0.7 x 1.2 cm

No discrete nodule or mass is identified within the left lobe of the
thyroid.
IMPRESSION: 1. No new or enlarging thyroid nodules.
2. Solitary partially cystic though predominantly solid
approximately 2.1 cm nodule within the right lobe of the thyroid is
grossly unchanged since the [DATE] examination though again meets
imaging criteria to recommend percutaneous sampling as clinically
indicated.

The above is in keeping with the ACR TI-RADS recommendations - [HOSPITAL] 0178;[DATE].

## 2017-01-18 IMAGING — US US THYROID BIOPSY
1 series · 11 of 11 positions shown · non-contrast
Comparison: Thyroid ultrasound 12/30/2015

INDICATION: RIGHT thyroid nodule

EXAM:
ULTRASOUND GUIDED NEEDLE ASPIRATE BIOPSY OF THE THYROID GLAND

[Series 1: us thyroid biopsy · 0.04mm/px · 11 acquisitions, 11 frames shown]
[im 1/11]
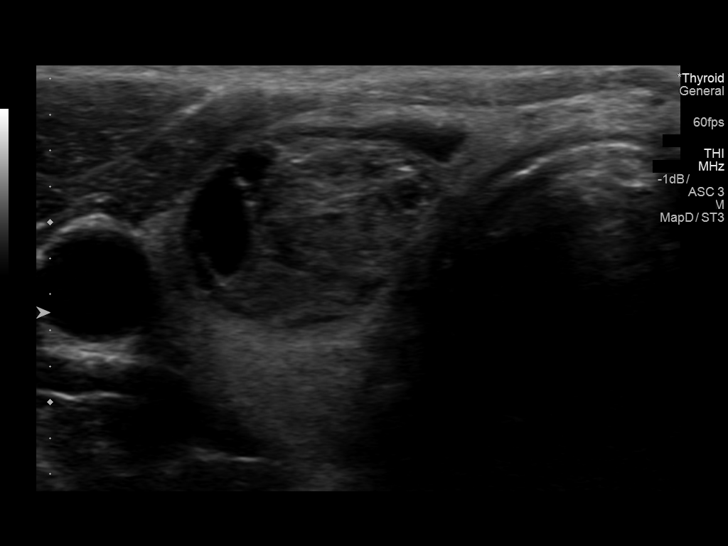
[im 2/11]
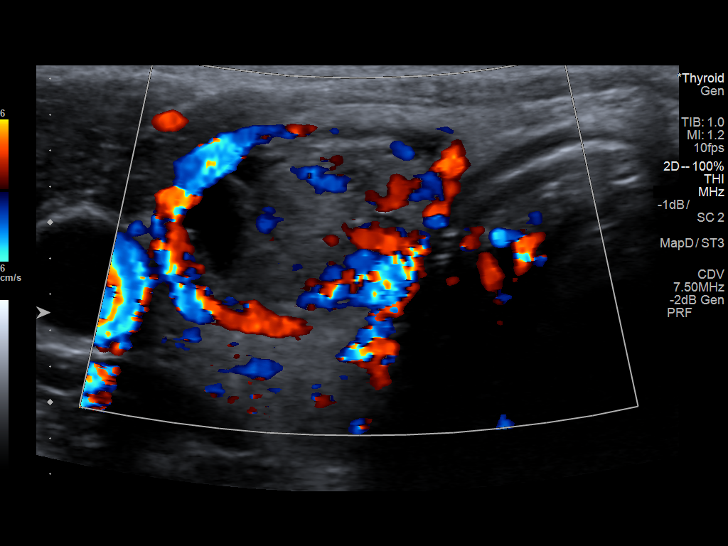
[im 3/11]
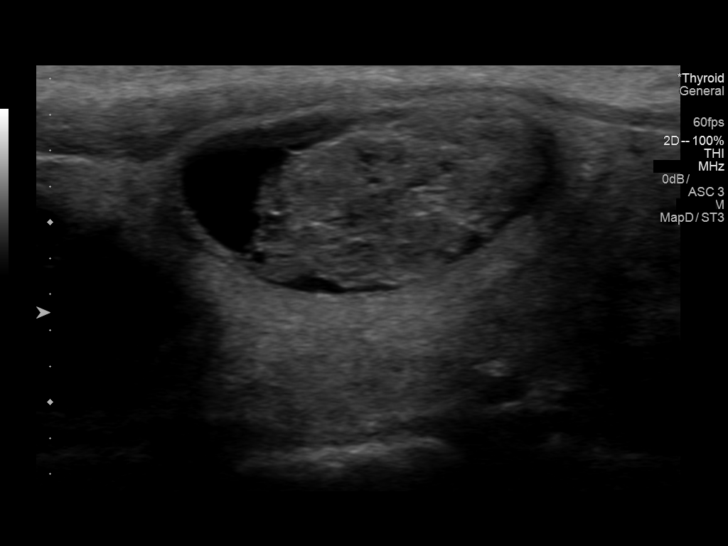
[im 4/11]
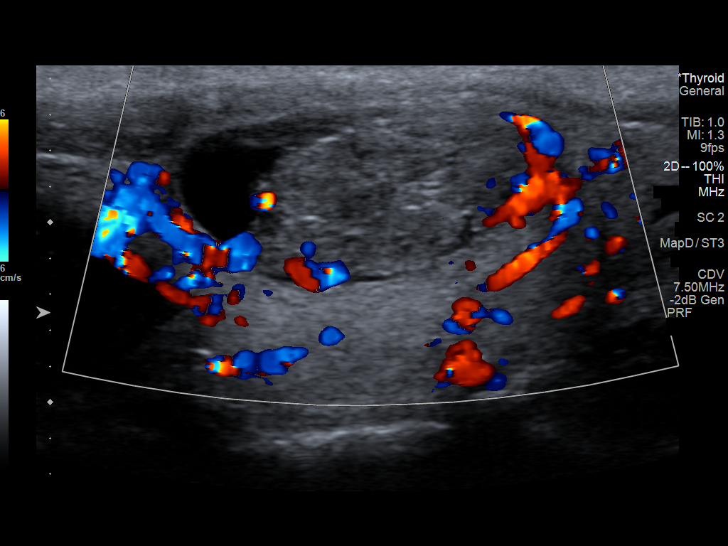
[im 5/11]
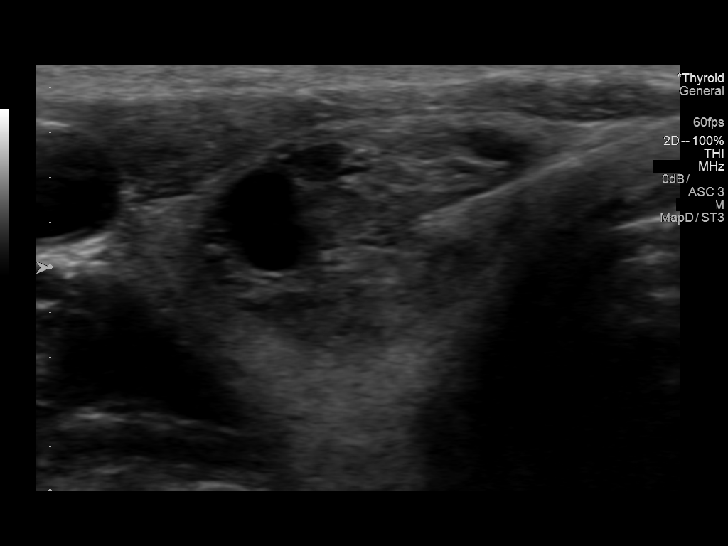
[im 6/11]
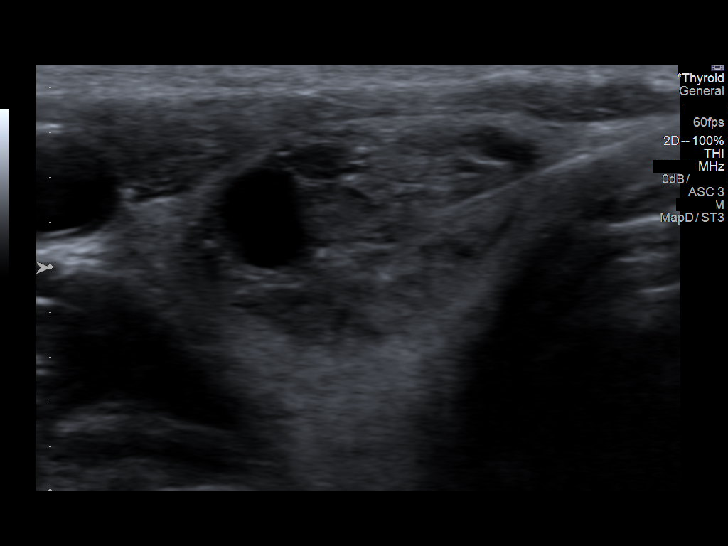
[im 7/11]
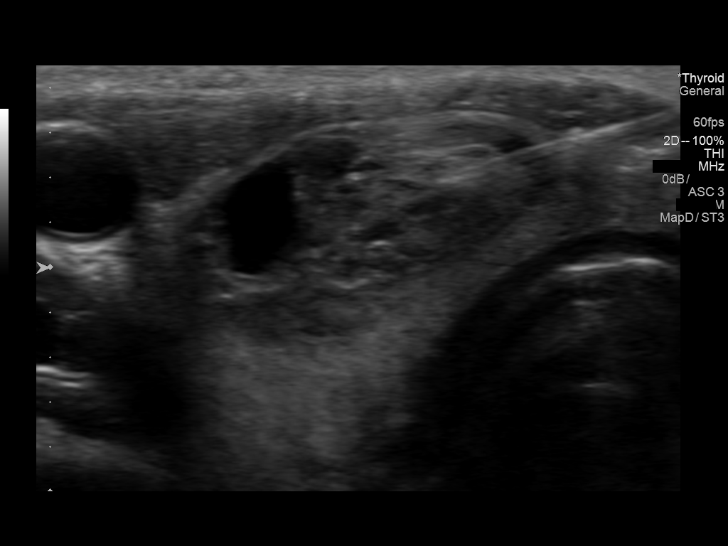
[im 8/11]
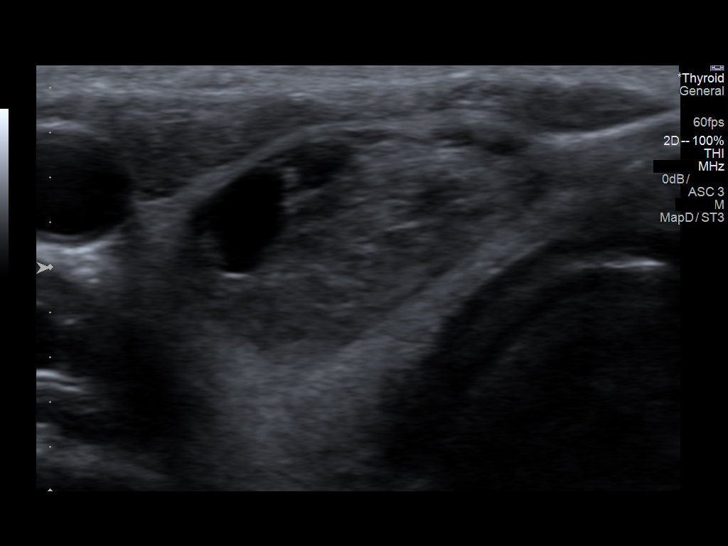
[im 9/11]
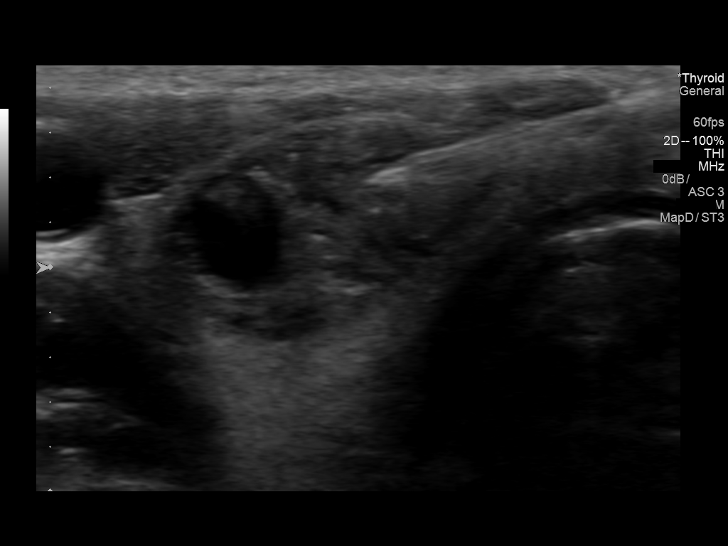
[im 10/11]
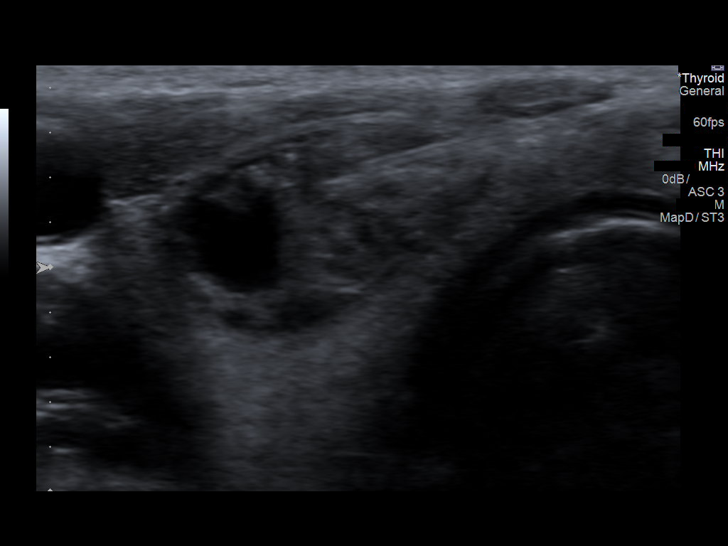
[im 11/11]
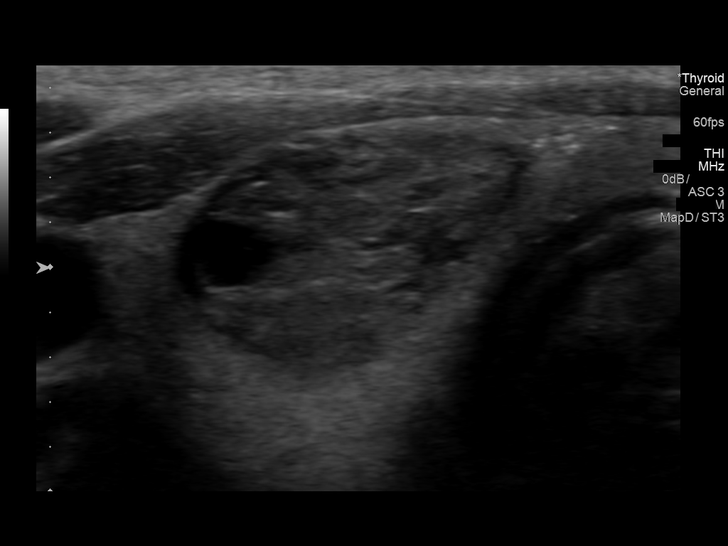

[11 of 11 positions shown; findings below may reference images not displayed]

MEDICATIONS:
2% lidocaine

COMPLICATIONS:
None immediate.

PROCEDURE:
Procedure, risks, benefits and alternatives discussed with the
patient.

Patient's questions answered.

Written informed consent for thyroid nodule FNA obtained.

Time-out protocol followed.

Dominant nodule in RIGHT localized by ultrasound

Nodule measures 2.3 x 1.6 x 1.2 cm, family solid with small cystic
foci.

Skin prepped and draped in usual sterile fashion.

Skin and overlying soft tissues anesthetized with 2 mL of 2%
lidocaine.

Under direct sonographic visualization, 3 fine-needle aspirates of
the RIGHT thyroid nodule were obtained.

Procedure tolerated well by patient.

Specimen sent to cytology for evaluation.
IMPRESSION: Ultrasound guided needle aspirate biopsy performed of a RIGHT
thyroid nodule.

## 2018-10-07 ENCOUNTER — Telehealth: Payer: Self-pay | Admitting: "Endocrinology

## 2018-10-07 NOTE — Telephone Encounter (Signed)
Patient left a VM that she has a growth on her thyroid. She has not been here since 01/2016. Does she need to do anything special before she comes to see Dr Dorris Fetch or can I just go ahead and schedule her as a follow up.    [phone]

## 2018-10-08 NOTE — Telephone Encounter (Signed)
Per Dr Dorris Fetch. Pt will need new referral with thyroid u/s results and labs. Attempted to call pt.. No answer.

## 2021-01-12 ENCOUNTER — Telehealth: Payer: Self-pay | Admitting: "Endocrinology

## 2021-01-12 NOTE — Telephone Encounter (Signed)
Patient had health dept send over a new referral for her. She does not have completed updated thyroid labs or ultrasound. Faxed that information back to [facility]. This must be completed prior to [place] scheduling her back in with Dr Fransico Him.
